# Patient Record
Sex: Female | Born: 1945 | Race: White | Hispanic: No | Smoking: Never smoker
Health system: Southern US, Community
[De-identification: ages and names within clinical notes are randomized; demographics above are authoritative.]

## PROBLEM LIST (undated history)

## (undated) DIAGNOSIS — F419 Anxiety disorder, unspecified: Secondary | ICD-10-CM

## (undated) DIAGNOSIS — R0602 Shortness of breath: Secondary | ICD-10-CM

## (undated) DIAGNOSIS — F329 Major depressive disorder, single episode, unspecified: Secondary | ICD-10-CM

## (undated) DIAGNOSIS — Z8719 Personal history of other diseases of the digestive system: Secondary | ICD-10-CM

## (undated) DIAGNOSIS — K219 Gastro-esophageal reflux disease without esophagitis: Secondary | ICD-10-CM

## (undated) DIAGNOSIS — D649 Anemia, unspecified: Secondary | ICD-10-CM

## (undated) DIAGNOSIS — J45909 Unspecified asthma, uncomplicated: Secondary | ICD-10-CM

## (undated) DIAGNOSIS — Z9889 Other specified postprocedural states: Secondary | ICD-10-CM

## (undated) DIAGNOSIS — M1990 Unspecified osteoarthritis, unspecified site: Secondary | ICD-10-CM

## (undated) DIAGNOSIS — F32A Depression, unspecified: Secondary | ICD-10-CM

## (undated) DIAGNOSIS — R112 Nausea with vomiting, unspecified: Secondary | ICD-10-CM

## (undated) DIAGNOSIS — E039 Hypothyroidism, unspecified: Secondary | ICD-10-CM

## (undated) DIAGNOSIS — E7800 Pure hypercholesterolemia, unspecified: Secondary | ICD-10-CM

## (undated) DIAGNOSIS — I10 Essential (primary) hypertension: Secondary | ICD-10-CM

## (undated) DIAGNOSIS — C4491 Basal cell carcinoma of skin, unspecified: Secondary | ICD-10-CM

## (undated) HISTORY — PX: BASAL CELL CARCINOMA EXCISION: SHX1214

## (undated) HISTORY — PX: BUNIONECTOMY: SHX129

## (undated) HISTORY — PX: TONSILLECTOMY: SUR1361

## (undated) HISTORY — PX: JOINT REPLACEMENT: SHX530

## (undated) HISTORY — PX: TOTAL KNEE ARTHROPLASTY: SHX125

## (undated) HISTORY — PX: DILATION AND CURETTAGE OF UTERUS: SHX78

---

## 1988-12-26 HISTORY — PX: KNEE ARTHROSCOPY: SUR90

## 1998-04-27 HISTORY — PX: VAGINAL HYSTERECTOMY: SUR661

## 2007-04-28 HISTORY — PX: CATARACT EXTRACTION W/ INTRAOCULAR LENS  IMPLANT, BILATERAL: SHX1307

## 2007-09-30 ENCOUNTER — Ambulatory Visit (HOSPITAL_COMMUNITY): Admission: RE | Admit: 2007-09-30 | Discharge: 2007-09-30 | Payer: Self-pay | Admitting: Family Medicine

## 2007-10-25 ENCOUNTER — Ambulatory Visit (HOSPITAL_COMMUNITY): Admission: RE | Admit: 2007-10-25 | Discharge: 2007-10-26 | Payer: Self-pay | Admitting: Ophthalmology

## 2008-01-31 ENCOUNTER — Emergency Department (HOSPITAL_COMMUNITY): Admission: EM | Admit: 2008-01-31 | Discharge: 2008-01-31 | Payer: Self-pay | Admitting: Emergency Medicine

## 2008-04-23 ENCOUNTER — Ambulatory Visit (HOSPITAL_COMMUNITY): Admission: RE | Admit: 2008-04-23 | Discharge: 2008-04-23 | Payer: Self-pay | Admitting: Family Medicine

## 2008-05-01 ENCOUNTER — Ambulatory Visit (HOSPITAL_COMMUNITY): Admission: RE | Admit: 2008-05-01 | Discharge: 2008-05-01 | Payer: Self-pay | Admitting: Family Medicine

## 2008-07-27 ENCOUNTER — Ambulatory Visit (HOSPITAL_COMMUNITY): Admission: RE | Admit: 2008-07-27 | Discharge: 2008-07-27 | Payer: Self-pay | Admitting: Endocrinology

## 2008-10-17 ENCOUNTER — Other Ambulatory Visit: Admission: RE | Admit: 2008-10-17 | Discharge: 2008-10-17 | Payer: Self-pay | Admitting: Interventional Radiology

## 2008-10-17 ENCOUNTER — Encounter: Admission: RE | Admit: 2008-10-17 | Discharge: 2008-10-17 | Payer: Self-pay | Admitting: Endocrinology

## 2008-10-17 ENCOUNTER — Encounter (INDEPENDENT_AMBULATORY_CARE_PROVIDER_SITE_OTHER): Payer: Self-pay | Admitting: Interventional Radiology

## 2009-03-20 ENCOUNTER — Ambulatory Visit (HOSPITAL_COMMUNITY): Admission: RE | Admit: 2009-03-20 | Discharge: 2009-03-20 | Payer: Self-pay | Admitting: Family Medicine

## 2009-07-12 ENCOUNTER — Ambulatory Visit (HOSPITAL_COMMUNITY): Admission: RE | Admit: 2009-07-12 | Discharge: 2009-07-12 | Payer: Self-pay | Admitting: Family Medicine

## 2010-02-27 ENCOUNTER — Ambulatory Visit (HOSPITAL_COMMUNITY)
Admission: RE | Admit: 2010-02-27 | Discharge: 2010-02-28 | Payer: Self-pay | Source: Home / Self Care | Admitting: Ophthalmology

## 2010-04-27 HISTORY — PX: WRIST FRACTURE SURGERY: SHX121

## 2010-05-18 ENCOUNTER — Encounter: Payer: Self-pay | Admitting: Family Medicine

## 2010-05-18 ENCOUNTER — Encounter: Payer: Self-pay | Admitting: *Deleted

## 2010-05-18 ENCOUNTER — Encounter: Payer: Self-pay | Admitting: Endocrinology

## 2010-05-19 ENCOUNTER — Encounter: Payer: Self-pay | Admitting: Family Medicine

## 2010-07-08 LAB — SURGICAL PCR SCREEN: MRSA, PCR: NEGATIVE

## 2010-09-09 ENCOUNTER — Observation Stay (HOSPITAL_COMMUNITY)
Admission: RE | Admit: 2010-09-09 | Discharge: 2010-09-10 | Disposition: A | Discharge status: Home / Self Care | Payer: Medicare Other | Source: Ambulatory Visit | Attending: Ophthalmology | Admitting: Ophthalmology

## 2010-09-09 DIAGNOSIS — H33009 Unspecified retinal detachment with retinal break, unspecified eye: Principal | ICD-10-CM | POA: Insufficient documentation

## 2010-09-09 NOTE — Op Note (Signed)
NAMELAURIANNE, Grace Patton               ACCOUNT NO.:  [id number]   MEDICAL RECORD NO.:  [id number]          PATIENT TYPE:  OIB   LOCATION:  5120                         FACILITY:  [facility]   PHYSICIAN:  Beulah Gandy. Ashley Royalty, M.D. DATE OF BIRTH:  08/24/1945   DATE OF PROCEDURE:  DATE OF DISCHARGE:                               OPERATIVE REPORT   ADMISSION DIAGNOSIS:  Retained lens material in the vitreous in the  right eye and retained lens material in the anterior chamber in right  eye.   PROCEDURES:  Pars plana vitrectomy was 25-gauge system, removal of  retained lens material from the vitreous, retinal photocoagulation for  retinal break at 8 o'clock, and anterior chamber washout with suture.   SURGEON:  Beulah Gandy. Ashley Royalty, MD   ASSISTANT:  Dr. Elza Rafter.   ANESTHESIA:  General.   DETAILS:  Usual prep and drape.  A 25-gauge trocar was placed at 8, 10,  and 2 o'clock.  Infusion at 8 o'clock.  Provisc was placed on the  corneal surface and the BIOM viewing system was moved into place.  The  pars plana vitrectomy was begun just behind the pseudophakos.  Lens  material was encountered and removed carefully.  The vitrectomy was  carried posteriorly with BIOM viewing down to the macular surface where  large pieces of lens material were lying on the macular region.  This  was carefully suctioned from the macular region and removed with the  vitreous cutter.  The vitrectomy was carried into far periphery where  additional fragments were found and removed.  A large retinal break was  seen at 8 o'clock.  The endolaser was positioned in the eye and 599  burns placed around the retinal break and around the retinal periphery  in two rows, power was 1000 milliwatts, 1000 microns each at 0.1 seconds  each.  Additional vitrectomy was then carried out removing lens material  from around the pseudophakos.  There was lens material in the anterior  chamber, and therefore the 25-gauge instrument was placed to  the  cataract wound at 12 o'clock into the anterior chamber to remove all  fragments.  This was also used as an IA tip and lens material was  removed from beneath the iris in the periphery.  Once all lens material  was removed from this area, the probe was positioned back in the  vitreous and high intensity lights were used to search for additional  fragments, they were all removed.  Once this was accomplished, the  instruments were removed from the eye and 10-0 nylon was used to close  the cataract wound.  A single suture was all that was required.  The  wounds were tested and found to be tight.  Polymyxin and gentamicin were  irrigated into Tenon space.  Marcaine was injected around the globe for  postop pain.  Decadron 10 mg was injected into the lower subconjunctival  space.  Closing pressure was 15 with a Baer keratometer.  Decadron 10 mg  was injected.  TobraDex ophthalmic ointment and patch and shield were  placed.  The patient was  awakened and taken to recovery in satisfactory  condition.   COMPLICATIONS:  None.   DURATION:  1 hour.      Beulah Gandy. Ashley Royalty, M.D.  Electronically Signed     JDM/MEDQ  D:  10/25/2007  T:  10/26/2007  Job:  [id number]

## 2010-09-19 NOTE — Op Note (Signed)
  NAMESHAKIA, Grace Patton               ACCOUNT NO.:  [id number]  MEDICAL RECORD NO.:  [id number]           PATIENT TYPE:  O  LOCATION:  5036                         FACILITY:  [facility]  PHYSICIAN:  Talena Neira D. Ashley Royalty, M.D. DATE OF BIRTH:  Sep 16, 1945  DATE OF PROCEDURE:  09/09/2010 DATE OF DISCHARGE:                              OPERATIVE REPORT   ADMISSION DIAGNOSIS:  Status post rhegmatogenous retinal detachment, silicone oil in the vitreous cavity and silicone oil in the anterior chamber.  PROCEDURES:  Pars plana vitrectomy, removal of silicone oil, retinal photocoagulation, removal of silicone oil from anterior chamber with 27- gauge apparatus.  SURGEON:  Beulah Gandy. Ashley Royalty, MD  ASSISTANT:  Rosalie Doctor.  ANESTHESIA:  MAC.  DETAILS:  Lidocaine 2% and 0.75% Marcaine were mixed with 1 mL Wydase in a 10 mL syringe.  This was delivered in the retrobulbar and Darel Hong fashion behind the globe and in the eyelids.  Pressure was applied until full anesthesia and akinesia was obtained.  The usual prep and drape was performed.  A 25-gauge trocar was placed at 8 and 10 o'clock.  A 19- gauge MVR incision was made after conjunctival peritomy at 2 o'clock. Provisc was placed on the corneal surface and the biome viewing system moved into place.  The silicone oil extractor was placed in the vitreous cavity and the oil was extracted under high suction and direct visualization.  The endolaser was positioned in the eye, 447 burns were placed around the retinal periphery at the base of the detachment, power was 1000 milliwatts, 1000 microns each and 0.1 seconds each.  A 27-gauge needle was placed into the anterior chamber through the corneal limbus at 9 o'clock to remove the silicone oil from the anterior chamber and from the anterior surface of the intraocular lens.  The vitrectomy cutter was used to remove silicone oil droplets from the posterior surface of the lens and the vitreous cavity.  The eye  was rinsed and rinsed and small beads of silicone oil were removed with suction and cutting.  The gas fluid exchange was performed of subtotal.  The instruments were removed from the eye and 9-0 nylon was used to close the sclerotomy sites.  The conjunctival peritomies were closed with 7-0 chromic suture.  Polymyxin and gentamicin were irrigated into Tenon space.  No atropine was used.  Decadron 10 mg was injected to the lower subconjunctival space.  Polysporin ophthalmic ointment, a patch, and shield were placed.  Closing pressure was less than 10 with a Barraquer tonometer.  The patient was taken to recovery in satisfactory condition.  COMPLICATIONS:  None.  DURATION:  One hour.     Beulah Gandy. Ashley Royalty, M.D.     JDM/MEDQ  D:  09/09/2010  T:  09/10/2010  Job:  [id number]  Electronically Signed by Alan Mulder M.D. on 09/19/2010 06:19:44 AM

## 2010-12-16 ENCOUNTER — Encounter (INDEPENDENT_AMBULATORY_CARE_PROVIDER_SITE_OTHER): Payer: Medicare Other | Admitting: Ophthalmology

## 2010-12-16 DIAGNOSIS — H43819 Vitreous degeneration, unspecified eye: Secondary | ICD-10-CM

## 2010-12-16 DIAGNOSIS — H4420 Degenerative myopia, unspecified eye: Secondary | ICD-10-CM

## 2010-12-16 DIAGNOSIS — H35349 Macular cyst, hole, or pseudohole, unspecified eye: Secondary | ICD-10-CM

## 2010-12-16 DIAGNOSIS — H33009 Unspecified retinal detachment with retinal break, unspecified eye: Secondary | ICD-10-CM

## 2010-12-20 IMAGING — US US BIOPSY
1 series · 8 of 8 positions shown · non-contrast
Comparison: none

CLINICAL DATA: Dominant complex cystic right thyroid mass.

ULTRASOUND-GUIDED THYROID ASPIRATION BIOPSY
TECHNIQUE: Survey ultrasound was performed and the dominant lesion
in the inferior pole right lobe was localized.  An appropriate skin
entry site was determined.  Skin was marked, then prepped with
Betadine, draped in usual sterile fashion, and infiltrated locally
with 1% lidocaine.  Under real-time ultrasound guidance, three
passes were made into the lesion with 25 gauge needles.
Additionally, approximate 5 ml of clear blood-tinged fluid were
aspirated with an 18 gauge needle from the cystic component.  The
patient tolerated procedure well, with no immediate complications.
IMPRESSION
1.  Technically successful ultrasound-guided thyroid aspiration
biopsy

[Series 1: us biopsy · 8 acquisitions, 8 frames shown]
[im 1/8]
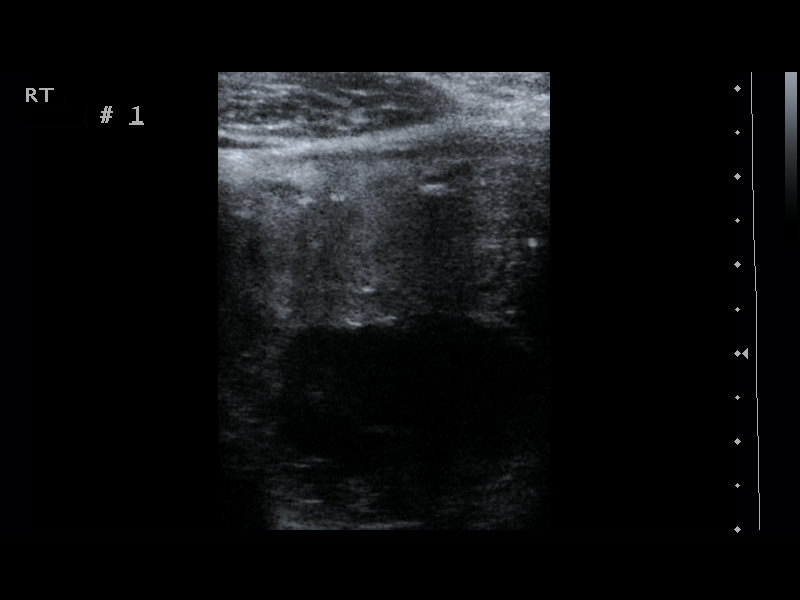
[im 2/8]
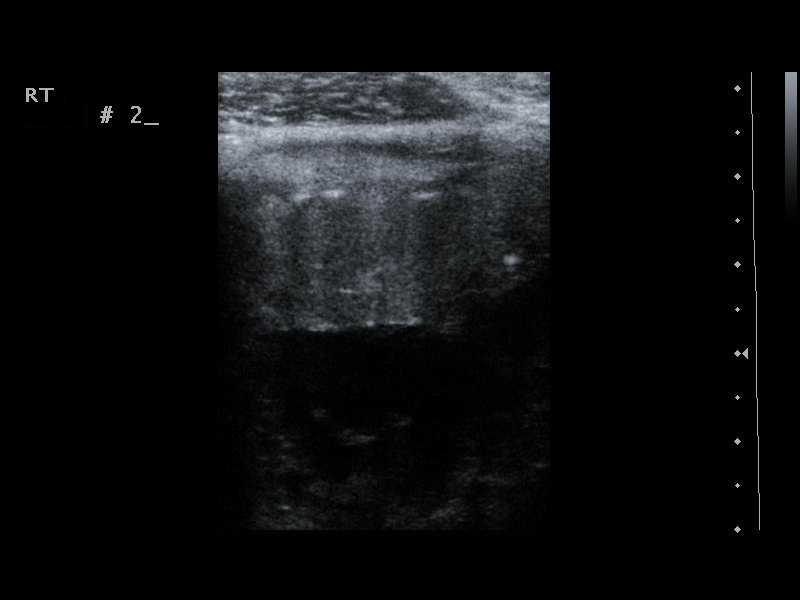
[im 3/8]
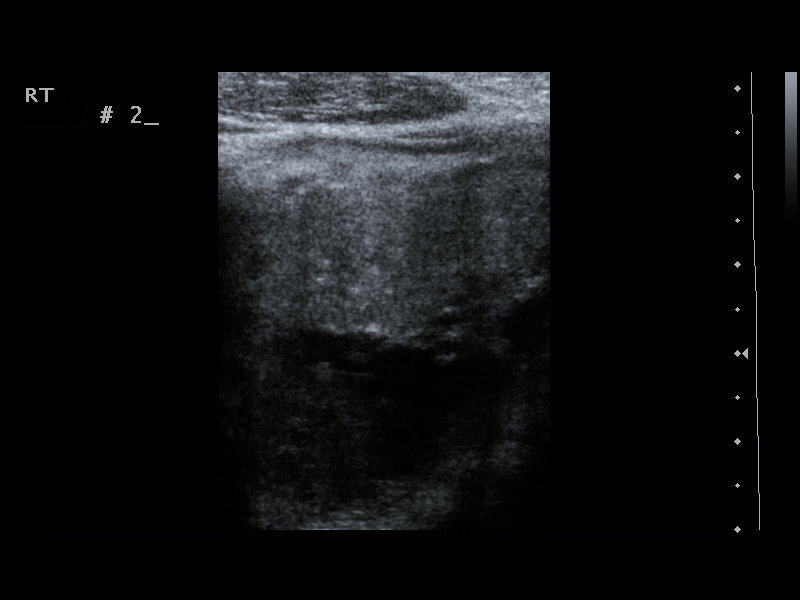
[im 4/8]
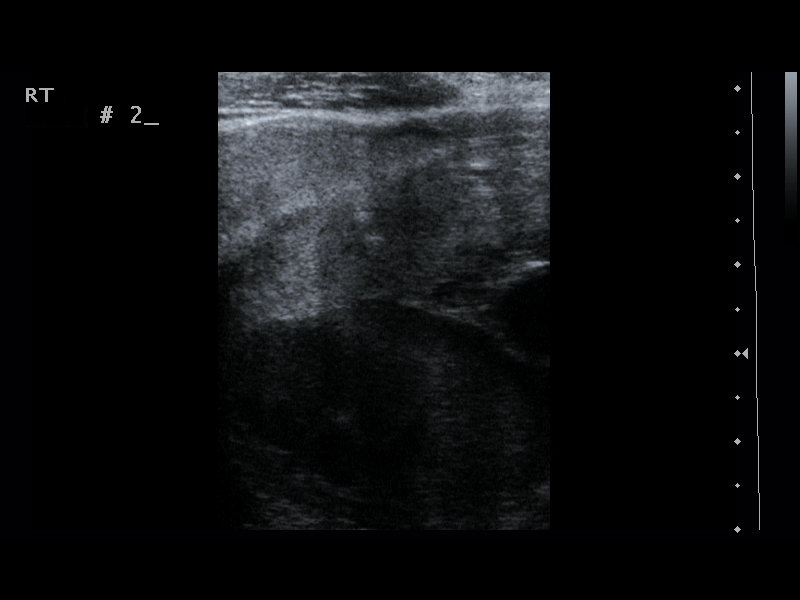
[im 5/8]
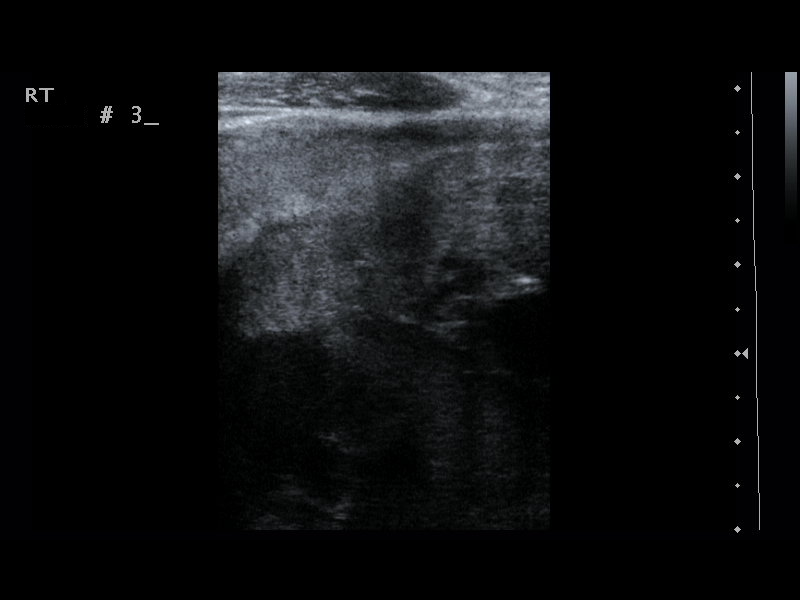
[im 6/8]
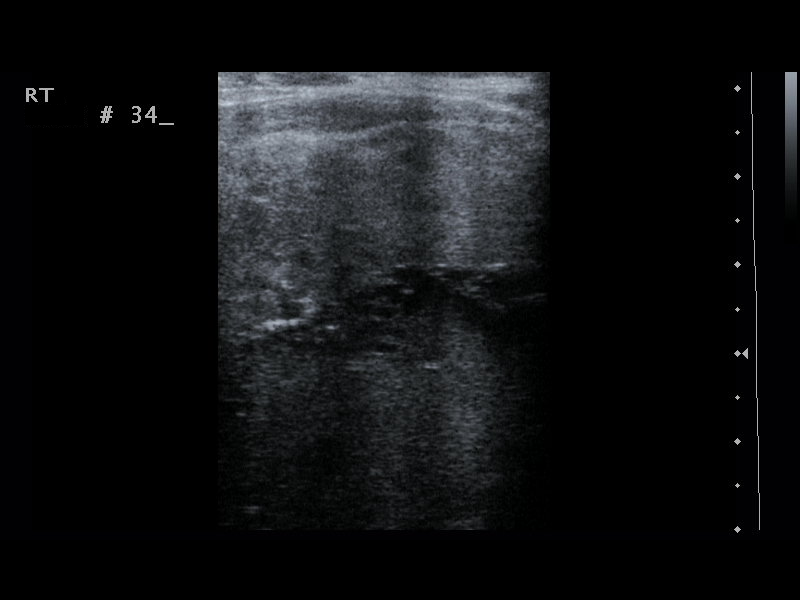
[im 7/8]
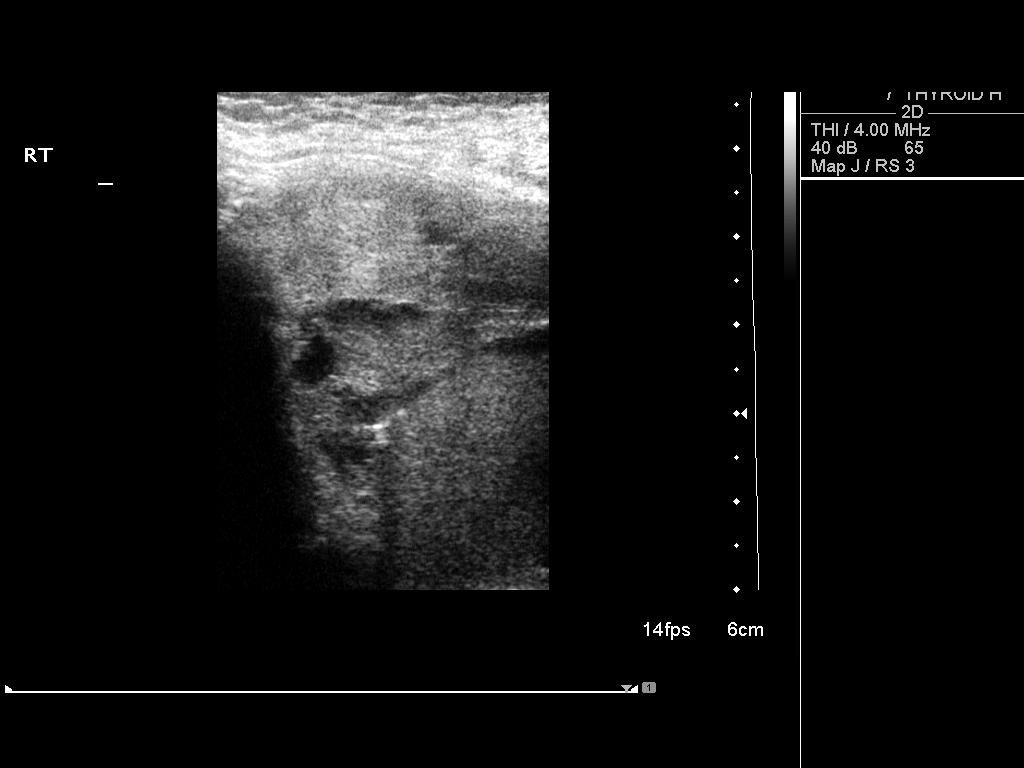
[im 8/8]
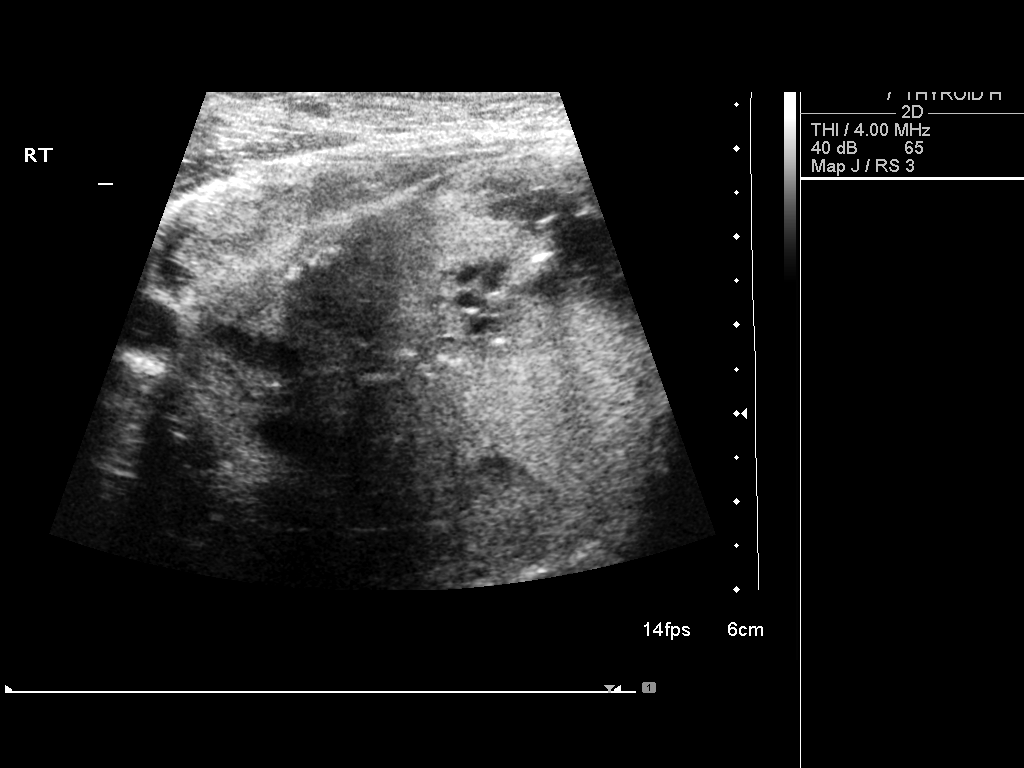

[8 of 8 positions shown; findings below may reference images not displayed]

## 2011-01-22 LAB — BASIC METABOLIC PANEL
CO2: 22
Chloride: 111
Glucose, Bld: 101 — ABNORMAL HIGH
Potassium: 3.4 — ABNORMAL LOW
Sodium: 140

## 2011-01-22 LAB — CBC
HCT: 38.5
Hemoglobin: 13.5
MCHC: 35.1
MCV: 90.1
RDW: 12.3

## 2011-06-18 ENCOUNTER — Ambulatory Visit (INDEPENDENT_AMBULATORY_CARE_PROVIDER_SITE_OTHER): Payer: Medicare Other | Admitting: Ophthalmology

## 2011-06-26 ENCOUNTER — Ambulatory Visit (INDEPENDENT_AMBULATORY_CARE_PROVIDER_SITE_OTHER): Payer: Medicare Other | Admitting: Ophthalmology

## 2011-06-30 ENCOUNTER — Ambulatory Visit (INDEPENDENT_AMBULATORY_CARE_PROVIDER_SITE_OTHER): Payer: BC Managed Care – PPO | Admitting: Ophthalmology

## 2011-06-30 DIAGNOSIS — H43819 Vitreous degeneration, unspecified eye: Secondary | ICD-10-CM

## 2011-06-30 DIAGNOSIS — H33009 Unspecified retinal detachment with retinal break, unspecified eye: Secondary | ICD-10-CM

## 2011-12-31 ENCOUNTER — Ambulatory Visit (INDEPENDENT_AMBULATORY_CARE_PROVIDER_SITE_OTHER): Payer: Medicare Other | Admitting: Ophthalmology

## 2012-01-11 ENCOUNTER — Encounter (INDEPENDENT_AMBULATORY_CARE_PROVIDER_SITE_OTHER): Payer: Medicare Other | Admitting: Ophthalmology

## 2012-01-11 DIAGNOSIS — H43819 Vitreous degeneration, unspecified eye: Secondary | ICD-10-CM

## 2012-01-11 DIAGNOSIS — H27139 Posterior dislocation of lens, unspecified eye: Secondary | ICD-10-CM

## 2012-01-11 DIAGNOSIS — H33009 Unspecified retinal detachment with retinal break, unspecified eye: Secondary | ICD-10-CM

## 2012-05-01 IMAGING — CR DG CHEST 2V
2 series · 2 of 2 positions shown · non-contrast
Comparison: 10/25/2007

CLINICAL DATA: Retinal detachment, preop evaluation

CHEST - 2 VIEW

[view not recorded (1 of 2)]
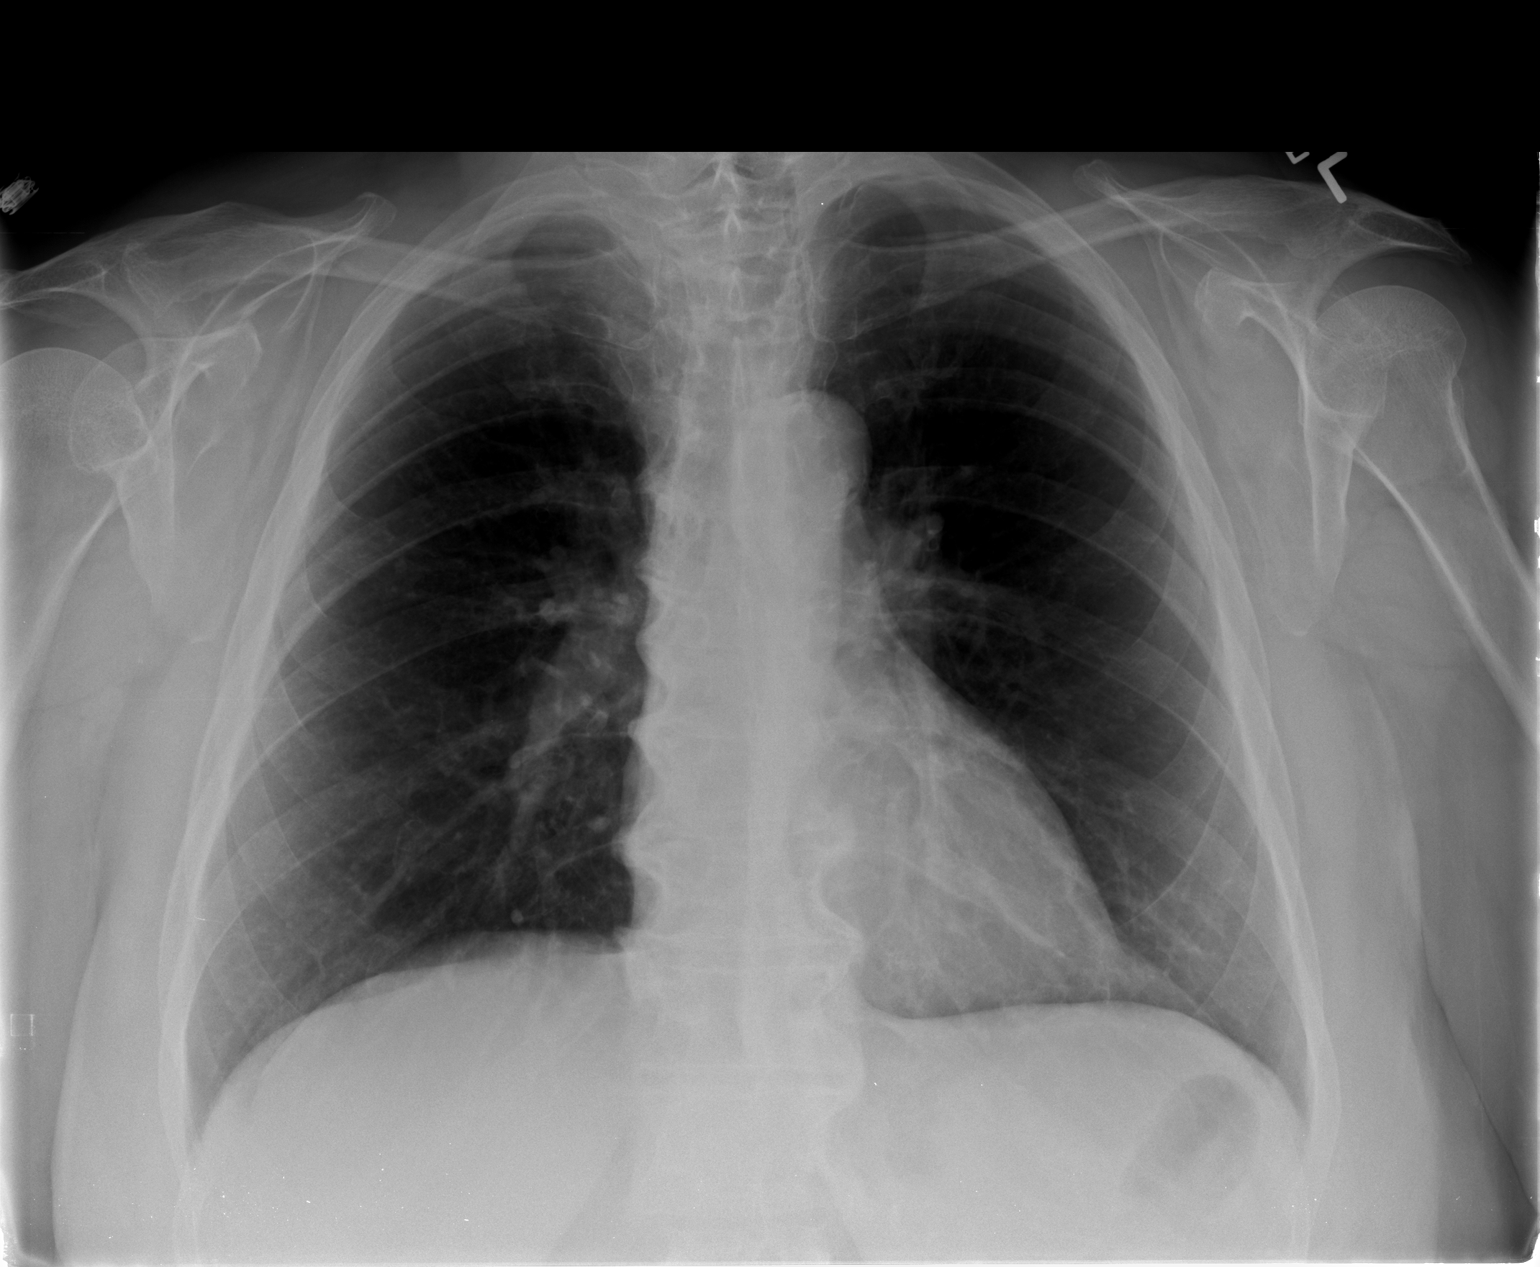

[view not recorded (2 of 2)]
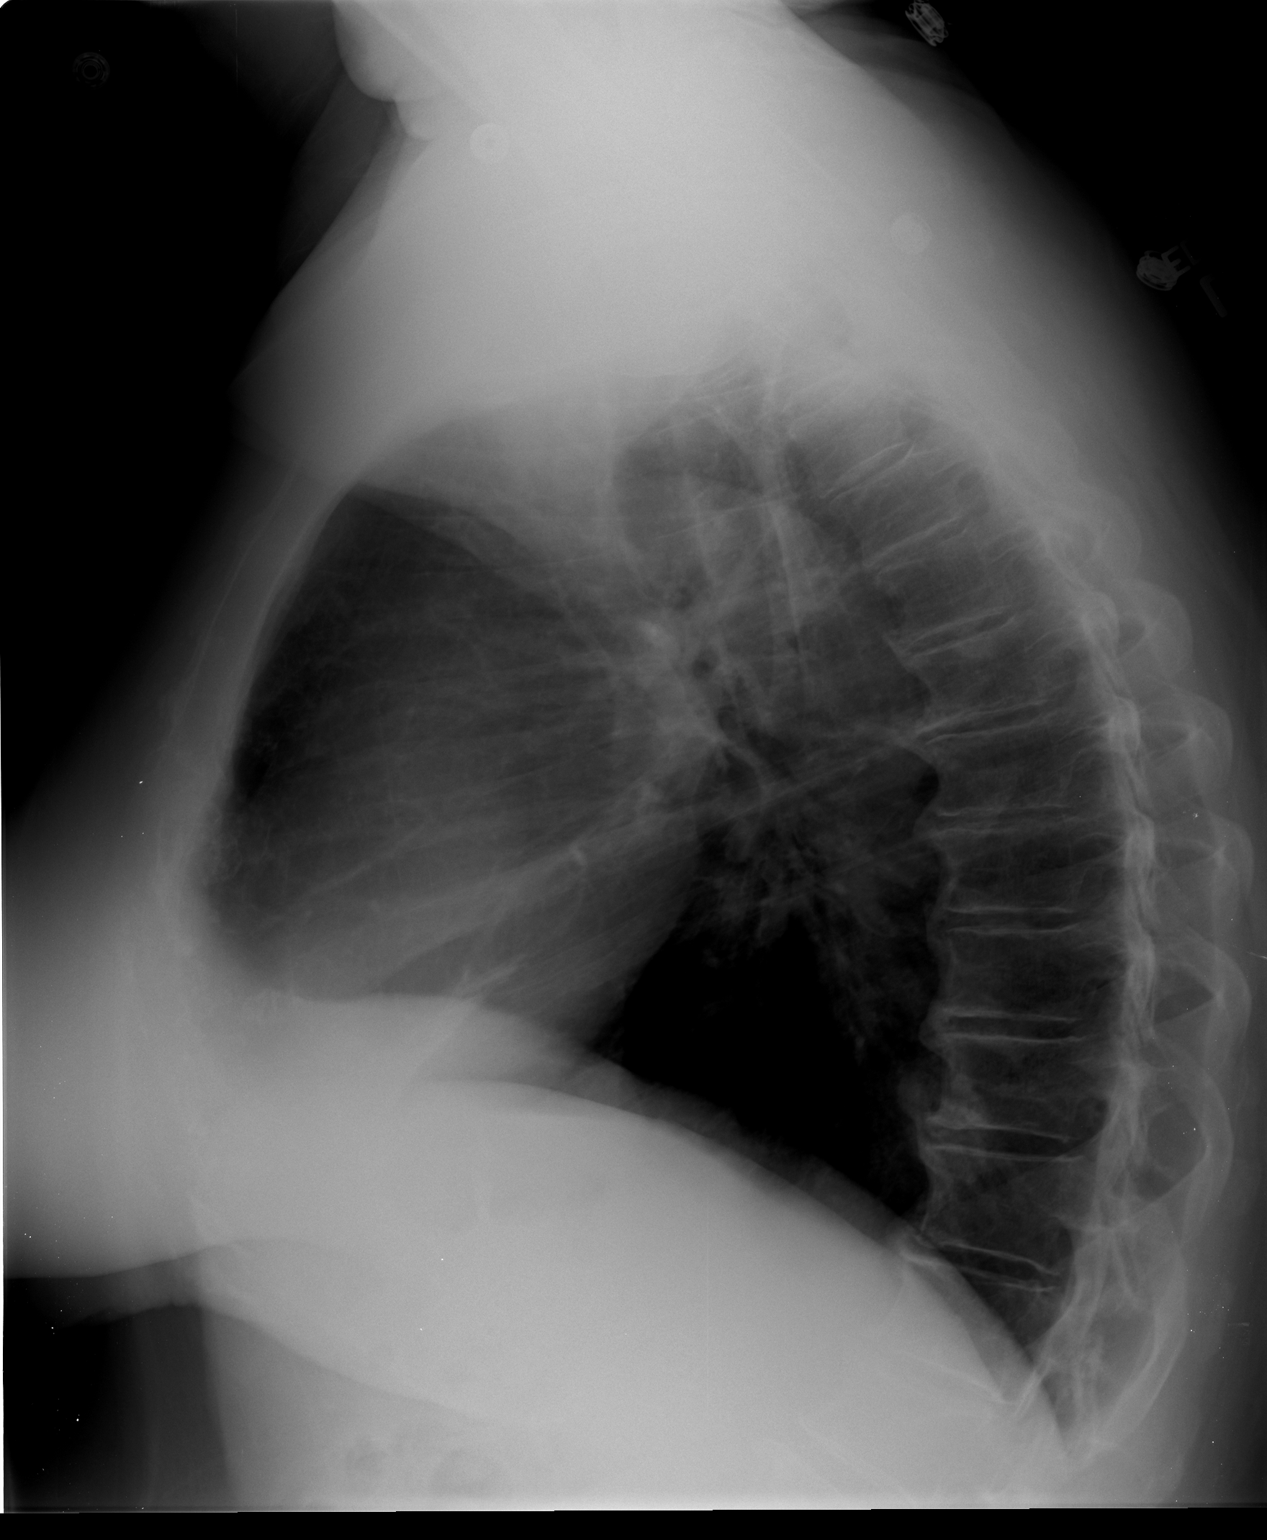

[2 of 2 positions shown; findings below may reference images not displayed]

FINDINGS: Normal heart size and vascularity.  Degenerative changes
of the spine.  Negative for acute pneumonia, edema, effusion or
pneumothorax.  Persistent left tracheal deviation which may be
related to right thyroid enlargement.
IMPRESSION: Stable exam.  No acute chest finding

## 2012-07-11 ENCOUNTER — Ambulatory Visit (INDEPENDENT_AMBULATORY_CARE_PROVIDER_SITE_OTHER): Payer: Medicare Other | Admitting: Ophthalmology

## 2012-07-20 ENCOUNTER — Ambulatory Visit (INDEPENDENT_AMBULATORY_CARE_PROVIDER_SITE_OTHER): Payer: Self-pay | Admitting: Ophthalmology

## 2012-08-04 ENCOUNTER — Ambulatory Visit (INDEPENDENT_AMBULATORY_CARE_PROVIDER_SITE_OTHER): Payer: Medicare Other | Admitting: Ophthalmology

## 2012-08-04 DIAGNOSIS — H33009 Unspecified retinal detachment with retinal break, unspecified eye: Secondary | ICD-10-CM

## 2012-08-04 DIAGNOSIS — H35039 Hypertensive retinopathy, unspecified eye: Secondary | ICD-10-CM

## 2012-08-04 DIAGNOSIS — H27139 Posterior dislocation of lens, unspecified eye: Secondary | ICD-10-CM

## 2012-08-04 DIAGNOSIS — H43819 Vitreous degeneration, unspecified eye: Secondary | ICD-10-CM

## 2012-08-04 DIAGNOSIS — I10 Essential (primary) hypertension: Secondary | ICD-10-CM

## 2013-02-06 ENCOUNTER — Ambulatory Visit (INDEPENDENT_AMBULATORY_CARE_PROVIDER_SITE_OTHER): Payer: Medicare Other | Admitting: Ophthalmology

## 2013-02-13 ENCOUNTER — Ambulatory Visit (INDEPENDENT_AMBULATORY_CARE_PROVIDER_SITE_OTHER): Payer: Medicare Other | Admitting: Ophthalmology

## 2013-03-27 ENCOUNTER — Ambulatory Visit (INDEPENDENT_AMBULATORY_CARE_PROVIDER_SITE_OTHER): Payer: Medicare Other | Admitting: Ophthalmology

## 2013-03-27 DIAGNOSIS — H33009 Unspecified retinal detachment with retinal break, unspecified eye: Secondary | ICD-10-CM | POA: Diagnosis not present

## 2013-03-27 DIAGNOSIS — H27139 Posterior dislocation of lens, unspecified eye: Secondary | ICD-10-CM | POA: Diagnosis not present

## 2013-03-27 DIAGNOSIS — H35039 Hypertensive retinopathy, unspecified eye: Secondary | ICD-10-CM | POA: Diagnosis not present

## 2013-03-27 DIAGNOSIS — I10 Essential (primary) hypertension: Secondary | ICD-10-CM | POA: Diagnosis not present

## 2013-03-27 DIAGNOSIS — H43819 Vitreous degeneration, unspecified eye: Secondary | ICD-10-CM | POA: Diagnosis not present

## 2013-03-27 NOTE — H&P (Signed)
Grace Patton is an 67 y.o. female.   Chief Complaint: blurred vision after retinal detachment surgery  Right eye HPI: Silicone oil and Scleral buckle were placed, now Oil is out and IOL is not correct power.  There is silicone oil on the IOL  Right eye No past medical history on file.  No past surgical history on file.  No family history on file. Social History:  has no tobacco, alcohol, and drug history on file.  Allergies: Allergies not on file  No prescriptions prior to admission    Review of systems otherwise negative  There were no vitals taken for this visit.  Physical exam: Mental status: oriented x3. Eyes: See eye exam associated with this date of surgery in media tab.  Scanned in by scanning center Ears, Nose, Throat: within normal limits Neck: Within Normal limits General: within normal limits Chest: Within normal limits Breast: deferred Heart: Within normal limits Abdomen: Within normal limits GU: deferred Extremities: within normal limits Skin: within normal limits  Assessment/Plan Silicone oil and Scleral buckle were placed, now Oil is out and IOL is not correct power.  There is silicone oil on the IOL  Right eye Plan: To [facility] for Pars plana vitrectomy, removal of intraocular lens, laser and gas injection right eye.  Sherrie George 03/27/2013, 5:28 PM

## 2013-03-28 ENCOUNTER — Encounter (HOSPITAL_COMMUNITY): Payer: Self-pay | Admitting: Pharmacy Technician

## 2013-04-11 ENCOUNTER — Encounter (HOSPITAL_COMMUNITY): Admission: RE | Payer: Self-pay | Source: Ambulatory Visit

## 2013-04-11 ENCOUNTER — Ambulatory Visit (HOSPITAL_COMMUNITY): Admission: RE | Admit: 2013-04-11 | Payer: Medicare Other | Source: Ambulatory Visit | Admitting: Ophthalmology

## 2013-04-11 ENCOUNTER — Inpatient Hospital Stay (INDEPENDENT_AMBULATORY_CARE_PROVIDER_SITE_OTHER): Payer: Medicare Other | Admitting: Ophthalmology

## 2013-04-11 SURGERY — PARS PLANA VITRECTOMY WITH 25G REMOVAL/SUTURE INTRAOCULAR LENS
Anesthesia: General | Laterality: Right

## 2013-04-18 ENCOUNTER — Inpatient Hospital Stay (INDEPENDENT_AMBULATORY_CARE_PROVIDER_SITE_OTHER): Payer: Medicare Other | Admitting: Ophthalmology

## 2013-07-25 NOTE — H&P (Signed)
Grace Patton is an 68 y.o. female.   Chief Complaint:poor vision after silicone oil removal, right eye HPI: Patient had retinal detachment with silicone oil in vitreous.  Silicone oil removed.  Now Intraocular lens is dislocated. Right eye  No past medical history on file.  No past surgical history on file.  No family history on file. Social History:  has no tobacco, alcohol, and drug history on file.  Allergies:  Allergies  Allergen Reactions  . Penicillins Hives    No prescriptions prior to admission    Review of systems otherwise negative  There were no vitals taken for this visit.  Physical exam: Mental status: oriented x3. Eyes: See eye exam associated with this date of surgery in media tab.  Scanned in by scanning center Ears, Nose, Throat: within normal limits Neck: Within Normal limits General: within normal limits Chest: Within normal limits Breast: deferred Heart: Within normal limits Abdomen: Within normal limits GU: deferred Extremities: within normal limits Skin: within normal limits  Assessment/Plan Dislocated intraocular lens, right eye Plan: To [facility] for Pars plana vitrectomy, laser, removal of dislocated intraocular lens, placement of secondary intraocular lens with suture, right eye  Cary Lothrop D 07/25/2013, 7:24 AM

## 2013-08-03 ENCOUNTER — Encounter (HOSPITAL_COMMUNITY): Payer: Self-pay | Admitting: Pharmacy Technician

## 2013-08-10 DIAGNOSIS — T8522XA Displacement of intraocular lens, initial encounter: Secondary | ICD-10-CM | POA: Diagnosis present

## 2013-08-10 DIAGNOSIS — H27139 Posterior dislocation of lens, unspecified eye: Secondary | ICD-10-CM | POA: Diagnosis present

## 2013-08-14 MED ORDER — TROPICAMIDE 1 % OP SOLN
1.0000 [drp] | OPHTHALMIC | Status: AC | PRN
Start: 2013-08-15 — End: 2013-08-15
  Administered 2013-08-15: 1 [drp] via OPHTHALMIC
  Filled 2013-08-14: qty 3

## 2013-08-14 MED ORDER — GATIFLOXACIN 0.5 % OP SOLN
1.0000 [drp] | OPHTHALMIC | Status: AC | PRN
Start: 2013-08-15 — End: 2013-08-15
  Administered 2013-08-15: 1 [drp] via OPHTHALMIC
  Filled 2013-08-14: qty 2.5

## 2013-08-14 MED ORDER — PHENYLEPHRINE HCL 2.5 % OP SOLN
1.0000 [drp] | OPHTHALMIC | Status: AC | PRN
Start: 2013-08-15 — End: 2013-08-15
  Administered 2013-08-15: 1 [drp] via OPHTHALMIC
  Filled 2013-08-14: qty 15

## 2013-08-14 MED ORDER — CYCLOPENTOLATE HCL 1 % OP SOLN
1.0000 [drp] | OPHTHALMIC | Status: AC | PRN
  Administered 2013-08-15: 1 [drp] via OPHTHALMIC
  Filled 2013-08-14: qty 2

## 2013-08-15 ENCOUNTER — Encounter (HOSPITAL_COMMUNITY): Payer: Self-pay | Admitting: *Deleted

## 2013-08-15 ENCOUNTER — Encounter (INDEPENDENT_AMBULATORY_CARE_PROVIDER_SITE_OTHER): Payer: Medicare Other | Admitting: Ophthalmology

## 2013-08-15 ENCOUNTER — Encounter (HOSPITAL_COMMUNITY)
Admission: RE | Disposition: A | Discharge status: Home / Self Care | Payer: Self-pay | Source: Ambulatory Visit | Attending: Ophthalmology

## 2013-08-15 ENCOUNTER — Ambulatory Visit (HOSPITAL_COMMUNITY)
Admission: RE | Admit: 2013-08-15 | Discharge: 2013-08-16 | Disposition: A | Discharge status: Home / Self Care | Payer: Medicare Other | Source: Ambulatory Visit | Attending: Ophthalmology | Admitting: Ophthalmology

## 2013-08-15 ENCOUNTER — Ambulatory Visit (HOSPITAL_COMMUNITY): Payer: Medicare Other | Admitting: Anesthesiology

## 2013-08-15 ENCOUNTER — Encounter (HOSPITAL_COMMUNITY): Payer: Medicare Other | Admitting: Anesthesiology

## 2013-08-15 DIAGNOSIS — T8529XA Other mechanical complication of intraocular lens, initial encounter: Secondary | ICD-10-CM | POA: Insufficient documentation

## 2013-08-15 DIAGNOSIS — H43819 Vitreous degeneration, unspecified eye: Secondary | ICD-10-CM

## 2013-08-15 DIAGNOSIS — I10 Essential (primary) hypertension: Secondary | ICD-10-CM

## 2013-08-15 DIAGNOSIS — H43399 Other vitreous opacities, unspecified eye: Secondary | ICD-10-CM

## 2013-08-15 DIAGNOSIS — J45909 Unspecified asthma, uncomplicated: Secondary | ICD-10-CM | POA: Insufficient documentation

## 2013-08-15 DIAGNOSIS — H27139 Posterior dislocation of lens, unspecified eye: Secondary | ICD-10-CM | POA: Diagnosis present

## 2013-08-15 DIAGNOSIS — T8522XA Displacement of intraocular lens, initial encounter: Secondary | ICD-10-CM | POA: Diagnosis present

## 2013-08-15 DIAGNOSIS — Y831 Surgical operation with implant of artificial internal device as the cause of abnormal reaction of the patient, or of later complication, without mention of misadventure at the time of the procedure: Secondary | ICD-10-CM | POA: Insufficient documentation

## 2013-08-15 DIAGNOSIS — H35039 Hypertensive retinopathy, unspecified eye: Secondary | ICD-10-CM

## 2013-08-15 DIAGNOSIS — K219 Gastro-esophageal reflux disease without esophagitis: Secondary | ICD-10-CM | POA: Insufficient documentation

## 2013-08-15 DIAGNOSIS — H33009 Unspecified retinal detachment with retinal break, unspecified eye: Secondary | ICD-10-CM

## 2013-08-15 HISTORY — DX: Pure hypercholesterolemia, unspecified: E78.00

## 2013-08-15 HISTORY — PX: PARS PLANA VITRECTOMY: SHX2166

## 2013-08-15 HISTORY — PX: PARS PLANA VITRECTOMY W/ FRAGMENTATION OF LENS: SHX2169

## 2013-08-15 HISTORY — PX: LASER PHOTO ABLATION: SHX5942

## 2013-08-15 HISTORY — DX: Personal history of other diseases of the digestive system: Z87.19

## 2013-08-15 HISTORY — DX: Basal cell carcinoma of skin, unspecified: C44.91

## 2013-08-15 HISTORY — DX: Major depressive disorder, single episode, unspecified: F32.9

## 2013-08-15 HISTORY — DX: Depression, unspecified: F32.A

## 2013-08-15 HISTORY — DX: Anemia, unspecified: D64.9

## 2013-08-15 HISTORY — DX: Hypothyroidism, unspecified: E03.9

## 2013-08-15 HISTORY — DX: Unspecified asthma, uncomplicated: J45.909

## 2013-08-15 HISTORY — DX: Anxiety disorder, unspecified: F41.9

## 2013-08-15 HISTORY — DX: Essential (primary) hypertension: I10

## 2013-08-15 HISTORY — DX: Shortness of breath: R06.02

## 2013-08-15 HISTORY — DX: Unspecified osteoarthritis, unspecified site: M19.90

## 2013-08-15 HISTORY — DX: Other specified postprocedural states: R11.2

## 2013-08-15 HISTORY — DX: Gastro-esophageal reflux disease without esophagitis: K21.9

## 2013-08-15 HISTORY — DX: Other specified postprocedural states: Z98.890

## 2013-08-15 SURGERY — PARS PLANA VITRECTOMY WITH 25G REMOVAL/SUTURE INTRAOCULAR LENS
Anesthesia: Monitor Anesthesia Care | Site: Eye | Laterality: Right

## 2013-08-15 MED ORDER — IRBESARTAN 75 MG PO TABS
75.0000 mg | ORAL_TABLET | Freq: Every day | ORAL | Status: DC
Start: 2013-08-15 — End: 2013-08-16
  Administered 2013-08-15: 75 mg via ORAL
  Filled 2013-08-15 (×2): qty 1

## 2013-08-15 MED ORDER — SODIUM HYALURONATE 10 MG/ML IO SOLN
INTRAOCULAR | Status: DC | PRN
  Administered 2013-08-15: 0.85 mL via INTRAOCULAR

## 2013-08-15 MED ORDER — DOCUSATE SODIUM 100 MG PO CAPS
100.0000 mg | ORAL_CAPSULE | Freq: Two times a day (BID) | ORAL | Status: DC
Start: 2013-08-15 — End: 2013-08-16
  Administered 2013-08-15: 100 mg via ORAL
  Filled 2013-08-15: qty 1

## 2013-08-15 MED ORDER — ADULT MULTIVITAMIN W/MINERALS CH
1.0000 | ORAL_TABLET | Freq: Every day | ORAL | Status: DC
  Filled 2013-08-15: qty 1

## 2013-08-15 MED ORDER — HYDROCODONE-ACETAMINOPHEN 5-325 MG PO TABS: 1.0000 | ORAL_TABLET | ORAL | Status: DC | PRN

## 2013-08-15 MED ORDER — LIDOCAINE HCL 2 % IJ SOLN
INTRAMUSCULAR | Status: DC | PRN
  Administered 2013-08-15: 12:00:00 via RETROBULBAR

## 2013-08-15 MED ORDER — PROPOFOL 10 MG/ML IV BOLUS
INTRAVENOUS | Status: AC
  Filled 2013-08-15: qty 20

## 2013-08-15 MED ORDER — TEMAZEPAM 15 MG PO CAPS: 15.0000 mg | ORAL_CAPSULE | Freq: Every evening | ORAL | Status: DC | PRN

## 2013-08-15 MED ORDER — PROPOFOL 10 MG/ML IV BOLUS
INTRAVENOUS | Status: DC | PRN
  Administered 2013-08-15: 30 mg via INTRAVENOUS

## 2013-08-15 MED ORDER — PREDNISOLONE ACETATE 1 % OP SUSP
1.0000 [drp] | Freq: Four times a day (QID) | OPHTHALMIC | Status: DC
  Filled 2013-08-15: qty 1

## 2013-08-15 MED ORDER — ALBUTEROL SULFATE HFA 108 (90 BASE) MCG/ACT IN AERS: 2.0000 | INHALATION_SPRAY | Freq: Four times a day (QID) | RESPIRATORY_TRACT | Status: DC | PRN

## 2013-08-15 MED ORDER — ROCURONIUM BROMIDE 50 MG/5ML IV SOLN
INTRAVENOUS | Status: AC
  Filled 2013-08-15: qty 1

## 2013-08-15 MED ORDER — ASPIRIN EC 81 MG PO TBEC
81.0000 mg | DELAYED_RELEASE_TABLET | Freq: Every day | ORAL | Status: DC
  Filled 2013-08-15: qty 1

## 2013-08-15 MED ORDER — ONDANSETRON HCL 4 MG/2ML IJ SOLN: 4.0000 mg | Freq: Four times a day (QID) | INTRAMUSCULAR | Status: DC | PRN

## 2013-08-15 MED ORDER — PHENYLEPHRINE HCL 2.5 % OP SOLN
1.0000 [drp] | OPHTHALMIC | Status: AC | PRN
  Administered 2013-08-15 (×2): 1 [drp] via OPHTHALMIC

## 2013-08-15 MED ORDER — ALBUTEROL SULFATE (2.5 MG/3ML) 0.083% IN NEBU: 2.5000 mg | INHALATION_SOLUTION | Freq: Four times a day (QID) | RESPIRATORY_TRACT | Status: DC | PRN

## 2013-08-15 MED ORDER — GATIFLOXACIN 0.5 % OP SOLN
1.0000 [drp] | Freq: Four times a day (QID) | OPHTHALMIC | Status: DC
  Filled 2013-08-15: qty 2.5

## 2013-08-15 MED ORDER — SODIUM CHLORIDE 0.45 % IV SOLN
INTRAVENOUS | Status: DC
  Administered 2013-08-15: 17:00:00 via INTRAVENOUS

## 2013-08-15 MED ORDER — TROPICAMIDE 1 % OP SOLN
1.0000 [drp] | OPHTHALMIC | Status: AC | PRN
  Administered 2013-08-15 (×2): 1 [drp] via OPHTHALMIC

## 2013-08-15 MED ORDER — LIDOCAINE HCL (CARDIAC) 20 MG/ML IV SOLN
INTRAVENOUS | Status: DC | PRN
  Administered 2013-08-15: 30 mg via INTRAVENOUS

## 2013-08-15 MED ORDER — SERTRALINE HCL 100 MG PO TABS
100.0000 mg | ORAL_TABLET | Freq: Every day | ORAL | Status: DC
  Filled 2013-08-15: qty 1

## 2013-08-15 MED ORDER — ACETAMINOPHEN 325 MG PO TABS: 325.0000 mg | ORAL_TABLET | ORAL | Status: DC | PRN

## 2013-08-15 MED ORDER — BRIMONIDINE TARTRATE 0.2 % OP SOLN
1.0000 [drp] | Freq: Two times a day (BID) | OPHTHALMIC | Status: DC
  Filled 2013-08-15: qty 5

## 2013-08-15 MED ORDER — GATIFLOXACIN 0.5 % OP SOLN
1.0000 [drp] | OPHTHALMIC | Status: AC | PRN
  Administered 2013-08-15 (×2): 1 [drp] via OPHTHALMIC

## 2013-08-15 MED ORDER — SODIUM CHLORIDE 0.9 % IJ SOLN
INTRAMUSCULAR | Status: DC | PRN
  Administered 2013-08-15: 12:00:00

## 2013-08-15 MED ORDER — ONDANSETRON HCL 4 MG/2ML IJ SOLN
INTRAMUSCULAR | Status: DC | PRN
  Administered 2013-08-15: 4 mg via INTRAVENOUS

## 2013-08-15 MED ORDER — FENTANYL CITRATE 0.05 MG/ML IJ SOLN
INTRAMUSCULAR | Status: DC | PRN
  Administered 2013-08-15: 25 ug via INTRAVENOUS

## 2013-08-15 MED ORDER — LIDOCAINE HCL (CARDIAC) 20 MG/ML IV SOLN
INTRAVENOUS | Status: AC
  Filled 2013-08-15: qty 5

## 2013-08-15 MED ORDER — ATROPINE SULFATE 1 % OP SOLN
OPHTHALMIC | Status: DC | PRN
  Administered 2013-08-15: 1 [drp] via OPHTHALMIC

## 2013-08-15 MED ORDER — LATANOPROST 0.005 % OP SOLN
1.0000 [drp] | Freq: Every day | OPHTHALMIC | Status: DC
  Filled 2013-08-15: qty 2.5

## 2013-08-15 MED ORDER — 0.9 % SODIUM CHLORIDE (POUR BTL) OPTIME
TOPICAL | Status: DC | PRN
  Administered 2013-08-15: 1000 mL

## 2013-08-15 MED ORDER — BACITRACIN-POLYMYXIN B 500-10000 UNIT/GM OP OINT
1.0000 "application " | TOPICAL_OINTMENT | Freq: Four times a day (QID) | OPHTHALMIC | Status: DC
  Filled 2013-08-15: qty 3.5

## 2013-08-15 MED ORDER — MAGNESIUM HYDROXIDE 400 MG/5ML PO SUSP: 15.0000 mL | Freq: Four times a day (QID) | ORAL | Status: DC | PRN

## 2013-08-15 MED ORDER — BSS IO SOLN
INTRAOCULAR | Status: DC | PRN
  Administered 2013-08-15: 15 mL via INTRAOCULAR

## 2013-08-15 MED ORDER — FENTANYL CITRATE 0.05 MG/ML IJ SOLN
INTRAMUSCULAR | Status: AC
  Filled 2013-08-15: qty 5

## 2013-08-15 MED ORDER — ONDANSETRON HCL 4 MG/2ML IJ SOLN
INTRAMUSCULAR | Status: AC
  Filled 2013-08-15: qty 2

## 2013-08-15 MED ORDER — DEXAMETHASONE SODIUM PHOSPHATE 10 MG/ML IJ SOLN
INTRAMUSCULAR | Status: DC | PRN
  Administered 2013-08-15: 10 mg

## 2013-08-15 MED ORDER — VERAPAMIL HCL ER 300 MG PO CP24: 300.0000 mg | ORAL_CAPSULE | Freq: Every day | ORAL | Status: DC

## 2013-08-15 MED ORDER — ATORVASTATIN CALCIUM 20 MG PO TABS
20.0000 mg | ORAL_TABLET | Freq: Every day | ORAL | Status: DC
  Administered 2013-08-15: 20 mg via ORAL
  Filled 2013-08-15 (×2): qty 1

## 2013-08-15 MED ORDER — VERAPAMIL HCL ER 180 MG PO TBCR
360.0000 mg | EXTENDED_RELEASE_TABLET | Freq: Every day | ORAL | Status: DC
  Filled 2013-08-15 (×2): qty 2

## 2013-08-15 MED ORDER — MIDAZOLAM HCL 2 MG/2ML IJ SOLN
INTRAMUSCULAR | Status: AC
  Filled 2013-08-15: qty 2

## 2013-08-15 MED ORDER — ZOLPIDEM TARTRATE 5 MG PO TABS: 5.0000 mg | ORAL_TABLET | Freq: Every evening | ORAL | Status: DC | PRN

## 2013-08-15 MED ORDER — ACETAZOLAMIDE SODIUM 500 MG IJ SOLR
500.0000 mg | Freq: Once | INTRAMUSCULAR | Status: AC
  Administered 2013-08-16: 500 mg via INTRAVENOUS
  Filled 2013-08-15: qty 500

## 2013-08-15 MED ORDER — FENTANYL CITRATE 0.05 MG/ML IJ SOLN: 25.0000 ug | INTRAMUSCULAR | Status: DC | PRN

## 2013-08-15 MED ORDER — CYCLOSPORINE 0.05 % OP EMUL
1.0000 [drp] | Freq: Two times a day (BID) | OPHTHALMIC | Status: DC
  Filled 2013-08-15 (×3): qty 1

## 2013-08-15 MED ORDER — MIDAZOLAM HCL 5 MG/5ML IJ SOLN
INTRAMUSCULAR | Status: DC | PRN
  Administered 2013-08-15: 2 mg via INTRAVENOUS

## 2013-08-15 MED ORDER — ACETAMINOPHEN 500 MG PO TABS: 500.0000 mg | ORAL_TABLET | Freq: Two times a day (BID) | ORAL | Status: DC | PRN

## 2013-08-15 MED ORDER — SODIUM CHLORIDE 0.9 % IV SOLN
INTRAVENOUS | Status: DC | PRN
  Administered 2013-08-15 (×2): via INTRAVENOUS

## 2013-08-15 MED ORDER — PHENYLEPHRINE 40 MCG/ML (10ML) SYRINGE FOR IV PUSH (FOR BLOOD PRESSURE SUPPORT)
PREFILLED_SYRINGE | INTRAVENOUS | Status: AC
  Filled 2013-08-15: qty 10

## 2013-08-15 MED ORDER — CYCLOPENTOLATE HCL 1 % OP SOLN
1.0000 [drp] | OPHTHALMIC | Status: AC | PRN
  Administered 2013-08-15 (×2): 1 [drp] via OPHTHALMIC

## 2013-08-15 MED ORDER — LEVOTHYROXINE SODIUM 125 MCG PO TABS
125.0000 ug | ORAL_TABLET | Freq: Every day | ORAL | Status: DC
  Filled 2013-08-15: qty 1

## 2013-08-15 MED ORDER — TETRACAINE HCL 0.5 % OP SOLN
2.0000 [drp] | Freq: Once | OPHTHALMIC | Status: DC
  Filled 2013-08-15: qty 2

## 2013-08-15 MED ORDER — CELECOXIB 200 MG PO CAPS
200.0000 mg | ORAL_CAPSULE | Freq: Every day | ORAL | Status: DC | PRN
  Filled 2013-08-15: qty 1

## 2013-08-15 MED ORDER — MORPHINE SULFATE 2 MG/ML IJ SOLN: 1.0000 mg | INTRAMUSCULAR | Status: DC | PRN

## 2013-08-15 MED ORDER — ARTIFICIAL TEARS OP OINT
TOPICAL_OINTMENT | OPHTHALMIC | Status: AC
  Filled 2013-08-15: qty 3.5

## 2013-08-15 MED ORDER — EPINEPHRINE HCL 1 MG/ML IJ SOLN
INTRAOCULAR | Status: DC | PRN
  Administered 2013-08-15: 12:00:00

## 2013-08-15 MED ORDER — BACITRACIN-POLYMYXIN B 500-10000 UNIT/GM OP OINT
TOPICAL_OINTMENT | OPHTHALMIC | Status: DC | PRN
  Administered 2013-08-15: 1 via OPHTHALMIC

## 2013-08-15 SURGICAL SUPPLY — 61 items
APPLICATOR DR MATTHEWS STRL (MISCELLANEOUS) IMPLANT
BLADE EYE CATARACT 19 1.4 BEAV (BLADE) IMPLANT
BLADE KERATOME 2.75 (BLADE) ×2 IMPLANT
CANNULA VLV SOFT TIP 25GA (OPHTHALMIC) ×2 IMPLANT
CORDS BIPOLAR (ELECTRODE) IMPLANT
COTTONBALL LRG STERILE PKG (GAUZE/BANDAGES/DRESSINGS) ×6 IMPLANT
COVER MAYO STAND STRL (DRAPES) ×4 IMPLANT
DRAPE INCISE 51X51 W/FILM STRL (DRAPES) ×2 IMPLANT
DRAPE OPHTHALMIC 77X100 STRL (CUSTOM PROCEDURE TRAY) ×2 IMPLANT
FILTER BLUE MILLIPORE (MISCELLANEOUS) IMPLANT
FORCEPS ECKARDT ILM 25G SERR (OPHTHALMIC RELATED) ×2 IMPLANT
FORCEPS HORIZONTAL 25G DISP (OPHTHALMIC RELATED) IMPLANT
GAS OPHTHALMIC (MISCELLANEOUS) IMPLANT
GLOVE BIOGEL PI IND STRL 6.5 (GLOVE) ×2 IMPLANT
GLOVE BIOGEL PI INDICATOR 6.5 (GLOVE) ×2
GLOVE SS BIOGEL STRL SZ 6.5 (GLOVE) ×2 IMPLANT
GLOVE SS BIOGEL STRL SZ 7 (GLOVE) ×1 IMPLANT
GLOVE SUPERSENSE BIOGEL SZ 6.5 (GLOVE) ×2
GLOVE SUPERSENSE BIOGEL SZ 7 (GLOVE) ×1
GLOVE SURG 8.5 LATEX PF (GLOVE) ×2 IMPLANT
GLOVE SURG SS PI 6.5 STRL IVOR (GLOVE) ×4 IMPLANT
GOWN STRL REUS W/ TWL LRG LVL3 (GOWN DISPOSABLE) ×4 IMPLANT
GOWN STRL REUS W/TWL LRG LVL3 (GOWN DISPOSABLE) ×4
KIT BASIN OR (CUSTOM PROCEDURE TRAY) ×2 IMPLANT
KIT ROOM TURNOVER OR (KITS) ×2 IMPLANT
KNIFE CRESCENT 1.75 EDGEAHEAD (BLADE) ×2 IMPLANT
MICROPICK 25G (MISCELLANEOUS)
NEEDLE 18GX1X1/2 (RX/OR ONLY) (NEEDLE) ×2 IMPLANT
NEEDLE 25GX 5/8IN NON SAFETY (NEEDLE) ×2 IMPLANT
NEEDLE 27GX1/2 REG BEVEL ECLIP (NEEDLE) IMPLANT
NEEDLE FILTER BLUNT 18X 1/2SAF (NEEDLE) ×1
NEEDLE FILTER BLUNT 18X1 1/2 (NEEDLE) ×1 IMPLANT
NEEDLE HYPO 30X.5 LL (NEEDLE) ×2 IMPLANT
NS IRRIG 1000ML POUR BTL (IV SOLUTION) ×2 IMPLANT
PACK VITRECTOMY CUSTOM (CUSTOM PROCEDURE TRAY) ×2 IMPLANT
PAD ARMBOARD 7.5X6 YLW CONV (MISCELLANEOUS) ×4 IMPLANT
PAK PIK VITRECTOMY CVS 25GA (OPHTHALMIC) ×2 IMPLANT
PENCIL BIPOLAR 25GA STR DISP (OPHTHALMIC RELATED) IMPLANT
PIC ILLUMINATED 25G (OPHTHALMIC) ×2
PICK MICROPICK 25G (MISCELLANEOUS) IMPLANT
PIK ILLUMINATED 25G (OPHTHALMIC) ×1 IMPLANT
PROBE LASER ILLUM FLEX CVD 25G (OPHTHALMIC) ×2 IMPLANT
ROLLS DENTAL (MISCELLANEOUS) ×4 IMPLANT
SCRAPER DIAMOND 25GA (OPHTHALMIC RELATED) IMPLANT
SPONGE SURGIFOAM ABS GEL 12-7 (HEMOSTASIS) ×2 IMPLANT
STOPCOCK 4 WAY LG BORE MALE ST (IV SETS) IMPLANT
SUT CHROMIC 7 0 TG140 8 (SUTURE) ×2 IMPLANT
SUT ETHILON 10 0 CS140 6 (SUTURE) ×2 IMPLANT
SUT ETHILON 9 0 BV100 4 (SUTURE) ×2 IMPLANT
SUT POLY NON ABSORB 10-0 8 STR (SUTURE) ×4 IMPLANT
SUT SILK 4 0 RB 1 (SUTURE) ×2 IMPLANT
SUT SILK 6 0 G 6 (SUTURE) ×2 IMPLANT
SYR 20CC LL (SYRINGE) ×2 IMPLANT
SYR 5ML LL (SYRINGE) IMPLANT
SYR BULB 3OZ (MISCELLANEOUS) ×2 IMPLANT
SYR TB 1ML LUER SLIP (SYRINGE) ×2 IMPLANT
SYRINGE 10CC LL (SYRINGE) ×4 IMPLANT
TAPE SURG TRANSPORE 1 IN (GAUZE/BANDAGES/DRESSINGS) ×1 IMPLANT
TAPE SURGICAL TRANSPORE 1 IN (GAUZE/BANDAGES/DRESSINGS) ×1
TOWEL OR 17X24 6PK STRL BLUE (TOWEL DISPOSABLE) ×4 IMPLANT
WATER STERILE IRR 1000ML POUR (IV SOLUTION) ×2 IMPLANT

## 2013-08-15 NOTE — H&P (Signed)
I examined the patient today and there is no change in the medical status 

## 2013-08-15 NOTE — Transfer of Care (Signed)
Immediate Anesthesia Transfer of Care Note  Patient: Grace Patton  Procedure(s) Performed: Procedure(s) with comments: PARS PLANA VITRECTOMY WITH 25G REMOVAL INTRAOCULAR LENS (Right) LASER PHOTO ABLATION (Right) - Endolaser  Patient Location: PACU  Anesthesia Type:MAC  Level of Consciousness: awake, alert , oriented and patient cooperative  Airway & Oxygen Therapy: Patient Spontanous Breathing  Post-op Assessment: Report given to PACU RN and Post -op Vital signs reviewed and stable  Post vital signs: Reviewed  Complications: No apparent anesthesia complications

## 2013-08-15 NOTE — Op Note (Signed)
[name], Grace Patton               ACCOUNT NO.:  [id number]  MEDICAL RECORD NO.:  [id number]  LOCATION:  6N15C                        FACILITY:  [facility]  PHYSICIAN:  Chrystie Nose. Zigmund Daniel, M.D. DATE OF BIRTH:  1945-06-04  DATE OF PROCEDURE:  08/15/2013 DATE OF DISCHARGE:                              OPERATIVE REPORT   ADMISSION DIAGNOSIS:  Dislocated intraocular lens and silicone oil in the anterior segment and on the lens.  PROCEDURES:  Pars plana vitrectomy, removal of intraocular lens, and removal of silicone oil from anterior segment; all in the right eye; also retinal photocoagulation, right eye.  SURGEON:  Chrystie Nose. Zigmund Daniel, M.D.  ASSISTANT:  Deatra Ina, SA.  ANESTHESIA:  Local MAC with monitored anesthetist.  DETAILS:  Proper drape system was prepared.  A Kirk Ruths and O'Brien block was delivered with 5 mL of 2% lidocaine and 5 mL of 0.75% Marcaine with 1 mL of Wydase, this was delivered by the surgeon in the [employer] and [employer].  Usual prep and drape, conjunctival peritomy from 10 o'clock to 2 o'clock to bear the superior corneoscleral junction.  A 25- gauge trocar was placed at 8, 10, and 2 o'clock, infusion at 8.  A 3- layered corneoscleral wound was created between 11 and 1 o'clock.  Pars plana vitrectomy was begun just behind the pupillary axis.  The intraocular lens came into place.  The corneal wound was opened.  The intraocular lens was dialed into the anterior segment and into the anterior chamber.  It was then dialed out of the eye and removed with 20- gauge forceps.  Silicone oil droplets were also seen in the anterior chamber and they were removed with free-flowing fluid through the pupil and out the corneoscleral wound.  Once the implant was removed, the iris was inspected and found to be in good condition.  The corneoscleral wound was closed with 4 interrupted 10-0 nylon sutures.  Additional pars plana vitrectomy was carried out at this point with removal  of pigment granules from the vitreous cavity.  The endolaser was positioned in the eye.  382 burns were placed around the retinal periphery with a power of 400 mW 1000 microns each and 0.1 seconds each.  The instruments were removed from the eye at this point.  A 9-0 nylon suture was placed at 10 o'clock on the sclerotomy.  The wounds were found to be secured.  The conjunctiva was closed with 7-0 chromic suture.  Polymyxin and gentamicin were irrigated into tenon space.  Atropine solution was applied.  Decadron was injected into the lower subconjunctival space. Closing pressure was 10 with a Pharmacologist.  Polysporin patch and shield were placed.  The patient was awakened and taken to recovery in a satisfactory condition.    Chrystie Nose. Zigmund Daniel, M.D.    JDM/MEDQ  D:  08/15/2013  T:  08/15/2013  Job:  [id number]

## 2013-08-15 NOTE — Anesthesia Postprocedure Evaluation (Signed)
  Anesthesia Post-op Note  Patient: Grace Patton  Procedure(s) Performed: Procedure(s) with comments: PARS PLANA VITRECTOMY WITH 25G REMOVAL INTRAOCULAR LENS (Right) LASER PHOTO ABLATION (Right) - Endolaser  Patient Location: PACU  Anesthesia Type:MAC  Level of Consciousness: awake and alert   Airway and Oxygen Therapy: Patient Spontanous Breathing  Post-op Pain: none  Post-op Assessment: Post-op Vital signs reviewed  Post-op Vital Signs: stable  Last Vitals:  Filed Vitals:   08/15/13 1430  BP: 122/51  Pulse: 63  Temp:   Resp:     Complications: No apparent anesthesia complications

## 2013-08-15 NOTE — Brief Op Note (Signed)
Brief Operative note   Preoperative diagnosis:  dislocated intra ocular lens right eye Postoperative diagnosis  Post-Op Diagnosis Codes:    * Posterior dislocation of lens [379.34]  Procedures: Pars plana vitrectomy with removal of dislocated intraocular lens.  Laser therapy right eye.  Surgeon:  Hayden Pedro, MD...  Assistant:  Deatra Ina SA    Anesthesia: Monitor Anesthesia Care  Specimen: none  Estimated blood loss:  1cc  Complications: none  Patient sent to PACU in good condition  Composed by Hayden Pedro MD  Dictation number: [phone]

## 2013-08-15 NOTE — Anesthesia Procedure Notes (Signed)
Procedure Name: MAC Date/Time: 08/15/2013 11:47 AM Performed by: Jenne Campus Pre-anesthesia Checklist: Patient identified, Emergency Drugs available, Suction available, Patient being monitored and Timeout performed Patient Re-evaluated:Patient Re-evaluated prior to inductionOxygen Delivery Method: Simple face mask and Nasal cannula

## 2013-08-15 NOTE — Anesthesia Preprocedure Evaluation (Addendum)
Anesthesia Evaluation  Patient identified by MRN, date of birth, ID band Patient awake    Reviewed: Allergy & Precautions, H&P , NPO status , Patient's Chart, lab work & pertinent test results  History of Anesthesia Complications Negative for: history of anesthetic complications  Airway Mallampati: II TM Distance: >3 FB Neck ROM: Full    Dental  (+) Teeth Intact, Dental Advisory Given   Pulmonary asthma ,  breath sounds clear to auscultation        Cardiovascular hypertension, Pt. on medications negative cardio ROS  Rhythm:Regular Rate:Normal     Neuro/Psych negative neurological ROS     GI/Hepatic Neg liver ROS, GERD-  Medicated and Controlled,  Endo/Other  negative endocrine ROSHypothyroidism Morbid obesity  Renal/GU negative Renal ROS     Musculoskeletal   Abdominal   Peds  Hematology negative hematology ROS (+)   Anesthesia Other Findings   Reproductive/Obstetrics negative OB ROS                          Anesthesia Physical Anesthesia Plan  ASA: II  Anesthesia Plan: MAC   Post-op Pain Management:    Induction: Intravenous  Airway Management Planned: Natural Airway and Nasal Cannula  Additional Equipment:   Intra-op Plan:   Post-operative Plan:   Informed Consent: I have reviewed the patients History and Physical, chart, labs and discussed the procedure including the risks, benefits and alternatives for the proposed anesthesia with the patient or authorized representative who has indicated his/her understanding and acceptance.   Dental advisory given  Plan Discussed with: CRNA and Surgeon  Anesthesia Plan Comments:         Anesthesia Quick Evaluation

## 2013-08-16 MED ORDER — BRIMONIDINE TARTRATE 0.2 % OP SOLN: 1.0000 [drp] | Freq: Two times a day (BID) | OPHTHALMIC | Status: AC

## 2013-08-16 MED ORDER — BACITRACIN-POLYMYXIN B 500-10000 UNIT/GM OP OINT: 1.0000 "application " | TOPICAL_OINTMENT | Freq: Four times a day (QID) | OPHTHALMIC | Status: AC

## 2013-08-16 MED ORDER — PREDNISOLONE ACETATE 1 % OP SUSP: 1.0000 [drp] | Freq: Four times a day (QID) | OPHTHALMIC | Status: AC

## 2013-08-16 MED ORDER — GATIFLOXACIN 0.5 % OP SOLN: 1.0000 [drp] | Freq: Four times a day (QID) | OPHTHALMIC | Status: AC

## 2013-08-16 NOTE — Progress Notes (Signed)
08/16/2013, 6:33 AM  Mental Status:  Awake, Alert, Oriented  Anterior segment: Cornea  Clear    Anterior Chamber Clear    Lens:   Aphakia  Intra Ocular Pressure 15 mmHg with Tonopen  Vitreous: Clear   Retina:  Attached Good laser reaction   Impression: Excellent result Retina attached   Final Diagnosis: Principal Problem:   Dislocated IOL (intraocular lens), posterior   Plan: start post operative eye drops.  Discharge to home.  Give post operative instructions  Hayden Pedro 08/16/2013, 6:33 AM

## 2013-08-16 NOTE — Discharge Summary (Signed)
Discharge summary not needed on [facility] patients per medical records. 

## 2013-08-17 ENCOUNTER — Encounter (HOSPITAL_COMMUNITY): Payer: Self-pay | Admitting: Ophthalmology

## 2013-08-21 ENCOUNTER — Encounter (INDEPENDENT_AMBULATORY_CARE_PROVIDER_SITE_OTHER): Payer: Medicare Other | Admitting: Ophthalmology

## 2013-08-21 DIAGNOSIS — H2700 Aphakia, unspecified eye: Secondary | ICD-10-CM

## 2013-09-11 ENCOUNTER — Encounter (INDEPENDENT_AMBULATORY_CARE_PROVIDER_SITE_OTHER): Payer: Medicare Other | Admitting: Ophthalmology

## 2013-09-11 DIAGNOSIS — H2700 Aphakia, unspecified eye: Secondary | ICD-10-CM

## 2013-11-27 ENCOUNTER — Encounter (INDEPENDENT_AMBULATORY_CARE_PROVIDER_SITE_OTHER): Payer: Medicare Other | Admitting: Ophthalmology

## 2013-11-27 DIAGNOSIS — I10 Essential (primary) hypertension: Secondary | ICD-10-CM

## 2013-11-27 DIAGNOSIS — H35039 Hypertensive retinopathy, unspecified eye: Secondary | ICD-10-CM

## 2013-11-27 DIAGNOSIS — H43819 Vitreous degeneration, unspecified eye: Secondary | ICD-10-CM

## 2013-11-27 DIAGNOSIS — H33009 Unspecified retinal detachment with retinal break, unspecified eye: Secondary | ICD-10-CM

## 2013-11-27 DIAGNOSIS — H2700 Aphakia, unspecified eye: Secondary | ICD-10-CM

## 2014-12-10 ENCOUNTER — Ambulatory Visit (INDEPENDENT_AMBULATORY_CARE_PROVIDER_SITE_OTHER): Payer: Medicare Other | Admitting: Ophthalmology

## 2015-01-17 ENCOUNTER — Ambulatory Visit (INDEPENDENT_AMBULATORY_CARE_PROVIDER_SITE_OTHER): Payer: Medicare Other | Admitting: Ophthalmology

## 2015-02-11 ENCOUNTER — Ambulatory Visit (INDEPENDENT_AMBULATORY_CARE_PROVIDER_SITE_OTHER): Payer: Medicare Other | Admitting: Ophthalmology

## 2015-02-25 ENCOUNTER — Ambulatory Visit (INDEPENDENT_AMBULATORY_CARE_PROVIDER_SITE_OTHER): Payer: Medicare Other | Admitting: Ophthalmology

## 2015-02-25 DIAGNOSIS — I10 Essential (primary) hypertension: Secondary | ICD-10-CM | POA: Diagnosis not present

## 2015-02-25 DIAGNOSIS — H2701 Aphakia, right eye: Secondary | ICD-10-CM

## 2015-02-25 DIAGNOSIS — H338 Other retinal detachments: Secondary | ICD-10-CM | POA: Diagnosis not present

## 2015-02-25 DIAGNOSIS — H35033 Hypertensive retinopathy, bilateral: Secondary | ICD-10-CM | POA: Diagnosis not present

## 2015-02-25 DIAGNOSIS — H4423 Degenerative myopia, bilateral: Secondary | ICD-10-CM | POA: Diagnosis not present

## 2015-02-25 DIAGNOSIS — H43812 Vitreous degeneration, left eye: Secondary | ICD-10-CM

## 2016-02-26 ENCOUNTER — Ambulatory Visit (INDEPENDENT_AMBULATORY_CARE_PROVIDER_SITE_OTHER): Payer: Medicare Other | Admitting: Ophthalmology

## 2016-03-26 ENCOUNTER — Ambulatory Visit (INDEPENDENT_AMBULATORY_CARE_PROVIDER_SITE_OTHER): Payer: Medicare Other | Admitting: Ophthalmology

## 2016-05-07 ENCOUNTER — Ambulatory Visit (INDEPENDENT_AMBULATORY_CARE_PROVIDER_SITE_OTHER): Payer: Medicare Other | Admitting: Ophthalmology

## 2016-05-07 DIAGNOSIS — H35033 Hypertensive retinopathy, bilateral: Secondary | ICD-10-CM

## 2016-05-07 DIAGNOSIS — H338 Other retinal detachments: Secondary | ICD-10-CM

## 2016-05-07 DIAGNOSIS — H43813 Vitreous degeneration, bilateral: Secondary | ICD-10-CM

## 2016-05-07 DIAGNOSIS — H4423 Degenerative myopia, bilateral: Secondary | ICD-10-CM | POA: Diagnosis not present

## 2016-05-07 DIAGNOSIS — I10 Essential (primary) hypertension: Secondary | ICD-10-CM | POA: Diagnosis not present

## 2016-05-07 DIAGNOSIS — H2701 Aphakia, right eye: Secondary | ICD-10-CM

## 2017-05-10 ENCOUNTER — Ambulatory Visit (INDEPENDENT_AMBULATORY_CARE_PROVIDER_SITE_OTHER): Payer: Medicare Other | Admitting: Ophthalmology

## 2017-05-24 ENCOUNTER — Encounter (INDEPENDENT_AMBULATORY_CARE_PROVIDER_SITE_OTHER): Payer: Medicare Other | Admitting: Ophthalmology

## 2017-06-09 ENCOUNTER — Encounter (INDEPENDENT_AMBULATORY_CARE_PROVIDER_SITE_OTHER): Payer: Medicare Other | Admitting: Ophthalmology

## 2017-07-09 ENCOUNTER — Encounter (INDEPENDENT_AMBULATORY_CARE_PROVIDER_SITE_OTHER): Payer: Medicare Other | Admitting: Ophthalmology

## 2017-07-22 ENCOUNTER — Encounter (INDEPENDENT_AMBULATORY_CARE_PROVIDER_SITE_OTHER): Payer: Medicare Other | Admitting: Ophthalmology

## 2017-07-22 DIAGNOSIS — H338 Other retinal detachments: Secondary | ICD-10-CM | POA: Diagnosis not present

## 2017-07-22 DIAGNOSIS — H43813 Vitreous degeneration, bilateral: Secondary | ICD-10-CM

## 2017-07-22 DIAGNOSIS — I10 Essential (primary) hypertension: Secondary | ICD-10-CM | POA: Diagnosis not present

## 2017-07-22 DIAGNOSIS — H4423 Degenerative myopia, bilateral: Secondary | ICD-10-CM

## 2017-07-22 DIAGNOSIS — H35033 Hypertensive retinopathy, bilateral: Secondary | ICD-10-CM

## 2018-07-27 ENCOUNTER — Encounter (INDEPENDENT_AMBULATORY_CARE_PROVIDER_SITE_OTHER): Payer: Medicare Other | Admitting: Ophthalmology

## 2018-10-03 ENCOUNTER — Encounter (INDEPENDENT_AMBULATORY_CARE_PROVIDER_SITE_OTHER): Payer: Medicare Other | Admitting: Ophthalmology
